# Patient Record
Sex: Female | Born: 1995 | Race: White | Hispanic: No | Marital: Single | Smoking: Never smoker
Health system: Southern US, Community
[De-identification: ages and names within clinical notes are randomized; demographics above are authoritative.]

## PROBLEM LIST (undated history)

## (undated) DIAGNOSIS — J45909 Unspecified asthma, uncomplicated: Secondary | ICD-10-CM

## (undated) DIAGNOSIS — Z87440 Personal history of urinary (tract) infections: Secondary | ICD-10-CM

## (undated) HISTORY — DX: Personal history of urinary (tract) infections: Z87.440

## (undated) HISTORY — DX: Unspecified asthma, uncomplicated: J45.909

---
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
## 2014-01-23 ENCOUNTER — Encounter: Payer: Self-pay | Admitting: Family Medicine

## 2014-01-23 ENCOUNTER — Ambulatory Visit: Payer: 59 | Admitting: Family Medicine

## 2014-01-23 ENCOUNTER — Ambulatory Visit (INDEPENDENT_AMBULATORY_CARE_PROVIDER_SITE_OTHER): Payer: 59 | Admitting: Family Medicine

## 2014-01-23 VITALS — BP 100/70 | HR 73 | Temp 98.4°F | Ht 61.5 in | Wt 138.5 lb

## 2014-01-23 DIAGNOSIS — M4306 Spondylolysis, lumbar region: Secondary | ICD-10-CM

## 2014-01-23 DIAGNOSIS — M4310 Spondylolisthesis, site unspecified: Secondary | ICD-10-CM

## 2014-01-23 NOTE — Progress Notes (Signed)
Pre visit review using our clinic review tool, if applicable. No additional management support is needed unless otherwise documented below in the visit note. 

## 2014-01-23 NOTE — Progress Notes (Signed)
[address] [place] [address] Phone: [phone] Fax: [phone]  Patient ID: Martha Landry MRN: [id number], DOB: [birth year], 18 y.o. Date of Encounter: 01/23/2014  Primary Physician:  No primary provider on file.   Chief Complaint: Follow-up   Subjective:   History of Present Illness:  Martha Landry is a 18 y.o. very pleasant female patient who presents with the following:  She is here in follow-up and feels great and is asymptomatic. Has been doing a little bit of weights, but no ext, no fast pitch.  Working in a Nurse, learning disability.   All feeling good Lessons to younger girls.    11/22/2013 Last OV with Martha Beat, MD  New patient, 2nd opinion case.   She initially saw someone at [facility], they are unsure of the name. At walk-in and has been seen at Orthopedics.   Pitcher on the softball team, "nothing else seems to bother it", then will get a crippling pain when she is trying to pitch.  2 months off from pitching.  Meloxicam - taking 15 mg. Pitched last month and now back   She also complains of pain with squats, clean and jerk, snatches, overhead presses. Some pain with running but less so.  No numbness or tingling anywhere.   [employer], pitching. 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   Very active, runs, lifts weights for softball.  Feels good otherwise and is essentially otherwise healthy.   Past Medical History, Surgical History, Social History, Family History, Problem List, Medications, and Allergies have been reviewed and updated if relevant.  Review of Systems:  GEN: No fevers, chills. Nontoxic. Primarily MSK c/o today. MSK: Detailed in the HPI GI: tolerating PO intake without difficulty Neuro: No numbness, parasthesias, or tingling associated. Otherwise, the pertinent positives and negatives are listed above and in the HPI, otherwise a full review of systems has been reviewed  and is negative unless noted positive.   Objective:   Physical Examination: BP 100/70  Pulse 73  Temp(Src) 98.4 F (36.9 C) (Oral)  Ht 5' 1.5" (1.562 m)  Wt 138 lb 8 oz (62.823 kg)  BMI 25.75 kg/m2  LMP 01/20/2014   GEN: Well-developed,well-nourished,in no acute distress; alert,appropriate and cooperative throughout examination HEENT: Normocephalic and atraumatic without obvious abnormalities. Ears, externally no deformities PULM: Breathing comfortably in no respiratory distress EXT: No clubbing, cyanosis, or edema PSYCH: Normally interactive. Cooperative during the interview. Pleasant. Friendly and conversant. Not anxious or depressed appearing. Normal, full affect.  Range of motion at  the waist: Flexion: normal Extension: normal Lateral bending: normal Rotation: all normal  No echymosis or edema Rises to examination table with no difficulty Gait: non antalgic  Inspection/Deformity: N Paraspinus Tenderness: none  B Ankle Dorsiflexion (L5,4): 5/5 B Great Toe Dorsiflexion (L5,4): 5/5 Heel Walk (L5): WNL Toe Walk (S1): WNL Rise/Squat (L4): WNL  SENSORY B Medial Foot (L4): WNL B Dorsum (L5): WNL B Lateral (S1): WNL Light Touch: WNL Pinprick: WNL  REFLEXES Knee (L4): 2+ Ankle (S1): 2+  B SLR, seated: neg B SLR, supine: neg B FABER: neg B Reverse FABER: neg B Greater Troch: NT B Log Roll: neg B Stork: NT B Sciatic Notch: NT   Radiology: Dg Lumbar Spine Complete  11/22/2013   CLINICAL DATA:  Progressive low back pain  EXAM: LUMBAR SPINE - COMPLETE 4+ VIEW  COMPARISON:  None.  FINDINGS: Frontal, lateral, spot lumbosacral lateral, bilateral oblique views were obtained. There  are 5 non-rib-bearing lumbar type vertebral bodies. There is no fracture or spondylolisthesis. Disk spaces appear intact. There is evidence of a pars defect on the left at L5 with sclerosis surrounding the pars defect on the left at L5. No other similar sclerosis is seen. No other pars  defects are appreciated. There is no appreciable facet arthropathy.  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 No other abnormality noted.  If further imaging is felt to be advisable based on patient's clinical symptoms, would advise nuclear medicine SPECT bone scan of the lumbar spine.  These results were called by telephone at the time of interpretation on 11/22/2013 at 1:25 PM to Dr. Hannah BeatSPENCER Martha Landry , who verbally acknowledged these results.   Electronically Signed   By: Bretta BangWilliam  Woodruff M.D.   On: 11/22/2013 13:25    Assessment & Plan:   Spondylolysis of lumbar region   She is doing great. Reviewed return to sport and return to pitching program  11/22/2013 Last OV with Martha BeatSpencer Sharay Bellissimo, MD  >45 minutes spent in face to face time with patient, >50% spent in counselling or coordination of care: I called Dr. Margarita GrizzleWoodruff at the time of the intial film and discussed case, my high clinical concern about spondylolysis, more likely on the LEFT based on exam. We look at the films via conference, and I agree that there are subtle changes at L5 c/w pars stress fracture and sclerosis.   The patient and her father and i reviewed all films, and discussed findings, anatomy, and implications.  Using an anatomical model, I reviewed with the patient the structures involved and how they related to their diagnosis .  We discussed +/- SPECT scanning to confirm diagnosis.   With my level of clinical suspicion, + XR, they understood and agreed that altered play for the summer was best course. No pitching, batting, hitting, or playing, at least 4 weeks - minimum. In discussion, summer matters very little to her play other than basic conditioning, weights. Reviewed no squatting, olympic lifts, running, or anything to load the spine. They will discuss with her coach, but anticipate full 8 weeks of rest.   Signed,  Cowen Pesqueira T. Jenaveve Fenstermaker, MD, CAQ Sports Medicine   Discontinued  Medications   No medications on file   Current Medications at Discharge:   Medication List       This list is accurate as of: 01/23/14 11:59 PM.  Always use your most recent med list.               meloxicam 15 MG tablet  Commonly known as:  MOBIC  Take 1 tablet by mouth daily.

## 2014-09-02 ENCOUNTER — Encounter: Payer: Self-pay | Admitting: Family Medicine

## 2014-09-02 ENCOUNTER — Ambulatory Visit (INDEPENDENT_AMBULATORY_CARE_PROVIDER_SITE_OTHER): Payer: BLUE CROSS/BLUE SHIELD | Admitting: Family Medicine

## 2014-09-02 ENCOUNTER — Ambulatory Visit (INDEPENDENT_AMBULATORY_CARE_PROVIDER_SITE_OTHER)
Admission: RE | Admit: 2014-09-02 | Discharge: 2014-09-02 | Disposition: A | Discharge status: Home / Self Care | Payer: BLUE CROSS/BLUE SHIELD | Source: Ambulatory Visit | Attending: Family Medicine | Admitting: Family Medicine

## 2014-09-02 VITALS — BP 110/72 | HR 69 | Temp 98.3°F | Ht 61.5 in | Wt 153.2 lb

## 2014-09-02 DIAGNOSIS — S6991XA Unspecified injury of right wrist, hand and finger(s), initial encounter: Secondary | ICD-10-CM

## 2014-09-02 DIAGNOSIS — M25531 Pain in right wrist: Secondary | ICD-10-CM

## 2014-09-02 NOTE — Progress Notes (Signed)
Pre visit review using our clinic review tool, if applicable. No additional management support is needed unless otherwise documented below in the visit note. 

## 2014-09-03 NOTE — Progress Notes (Signed)
Dr. Karleen HampshireSpencer T. Oley Lahaie, MD, CAQ Sports Medicine Primary Care and Sports Medicine [address] [place], [id number] Phone: [phone] Fax: [phone]  09/02/2014  Patient: Martha Landry, MRN: [id number], DOB: [birth year], 19 y.o.  Primary Physician:  No primary care provider on file.  Chief Complaint: Hand Injury  Subjective:   Martha Landry is a 19 y.o. 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  She was able to continue pitching, but she wanted to, and have this evaluated and make sure there was no fracture.  There is a small amount of swelling.  No bruising.  Past Medical History, Surgical History, Social History, Family History, Problem List, Medications, and Allergies have been reviewed and updated if relevant.  GEN: No fevers, chills. Nontoxic. Primarily MSK c/o today. MSK: Detailed in the HPI GI: tolerating PO intake without difficulty Neuro: No numbness, parasthesias, or tingling associated. Otherwise the pertinent positives of the ROS are noted above.   Objective:   BP 110/72 mmHg  Pulse 69  Temp(Src) 98.3 F (36.8 C) (Oral)  Ht 5' 1.5" (1.562 m)  Wt 153 lb 4 oz (69.514 kg)  BMI 28.49 kg/m2  LMP 08/19/2014   GEN: WDWN, NAD, Non-toxic, Alert & Oriented x 3 HEENT: Atraumatic, Normocephalic.  Ears and Nose: No external deformity. EXTR: No clubbing/cyanosis/edema NEURO: Normal gait.  PSYCH: Normally interactive. Conversant. Not depressed or anxious appearing.  Calm demeanor.    On the right hand and all fingers she is nontender.  She does have some tenderness at the base of the second and third metacarpals.  Axial loading is nontender.  There is minimal pain with ulnar and radial deviation.  Nontender throughout the radius and ulna.  Her grip is normal and intact.  Radiology: Dg Wrist Complete Right  09/02/2014   CLINICAL DATA:  Softball injury of the right hand  and wrist yesterday  EXAM: RIGHT WRIST - COMPLETE 3+ VIEW  COMPARISON:  Right hand series of today's date  FINDINGS: The bones of the right wrist are adequately mineralized. There is no acute fracture nor dislocation. Specific attention to the intercarpal and carpo metacarpal joints reveals no acute abnormality. The soft tissues are unremarkable.  IMPRESSION: There is no acute bony abnormality of the right wrist.   Electronically Signed   By: David  SwazilandJordan   On: 09/02/2014 15:46   Dg Hand Complete Right  09/02/2014   CLINICAL DATA:  Softball injury yesterday now with pain in the metacarpal region  EXAM: RIGHT HAND - COMPLETE 3+ VIEW  COMPARISON:  Right wrist series of today's date  FINDINGS: The bones of the hand are adequately mineralized. The interphalangeal, metacarpophalangeal, and carpometacarpal joints are normal. The phalanges and metacarpals are intact. The soft tissues are unremarkable. The visualized portions of the carpal bones and distal radius and ulna are unremarkable.  IMPRESSION: There is no acute bony abnormality of the right hand.   Electronically Signed   By: David  Sw[place]Jordan   On: 09/02/2014 15:25    Assessment and Plan:   Hand injury, right, initial encounter - Plan: DG Hand Complete Right  Wrist pain, right - Plan: DG Wrist Complete Right  No apparent fx. Bone contusion only. Can play as tolerated. I reviewed the films with Dr. Swazil[place]Jordan on the phone, as well.  New Prescriptions   No medications on file   Orders Placed This Encounter  Procedures  . DG Hand  Complete Right  . DG Wrist Complete Right    Signed,  Lamanda Rudder T. Jemeka Wagler, MD   Patient's Medications  New Prescriptions   No medications on file  Previous Medications   No medications on file  Modified Medications   No medications on file  Discontinued Medications   MELOXICAM (MOBIC) 15 MG TABLET    Take 1 tablet by mouth daily.

## 2014-09-16 ENCOUNTER — Encounter: Payer: Self-pay | Admitting: Family Medicine

## 2015-03-21 ENCOUNTER — Ambulatory Visit
Admission: RE | Admit: 2015-03-21 | Discharge: 2015-03-21 | Disposition: A | Discharge status: Home / Self Care | Payer: BLUE CROSS/BLUE SHIELD | Source: Ambulatory Visit | Attending: Pediatrics | Admitting: Pediatrics

## 2015-03-21 DIAGNOSIS — R079 Chest pain, unspecified: Secondary | ICD-10-CM | POA: Insufficient documentation

## 2015-03-29 IMAGING — CR DG HAND COMPLETE 3+V*R*
3 series · 3 of 3 positions shown · non-contrast
Comparison: Right wrist series of today's date

CLINICAL DATA: Softball injury yesterday now with pain in the
metacarpal region

EXAM:
RIGHT HAND - COMPLETE 3+ VIEW

[view not recorded (1 of 3)]
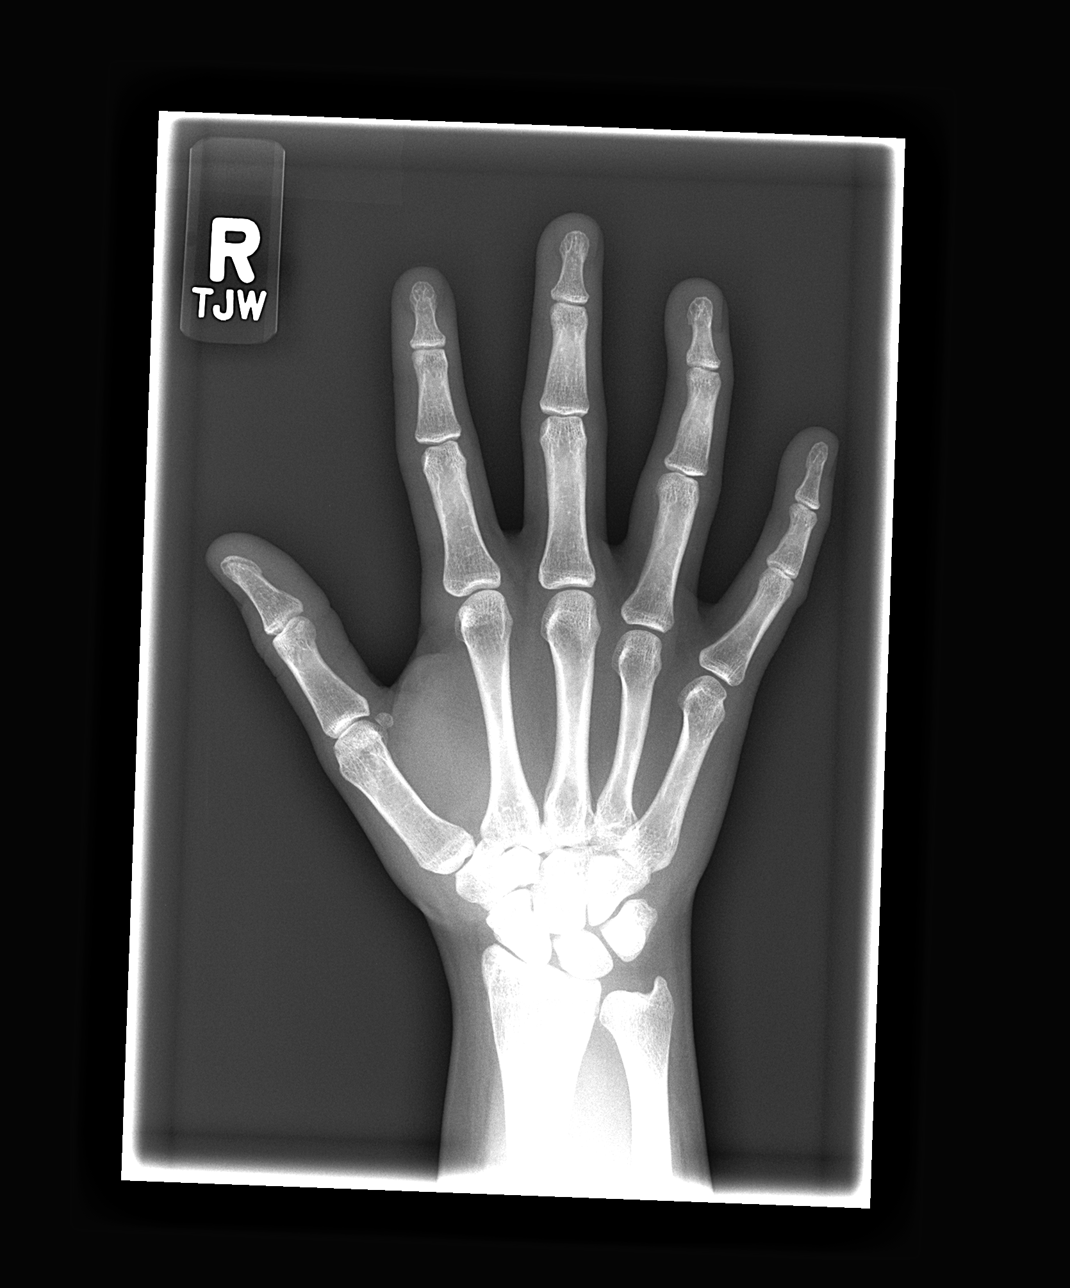

[view not recorded (2 of 3)]
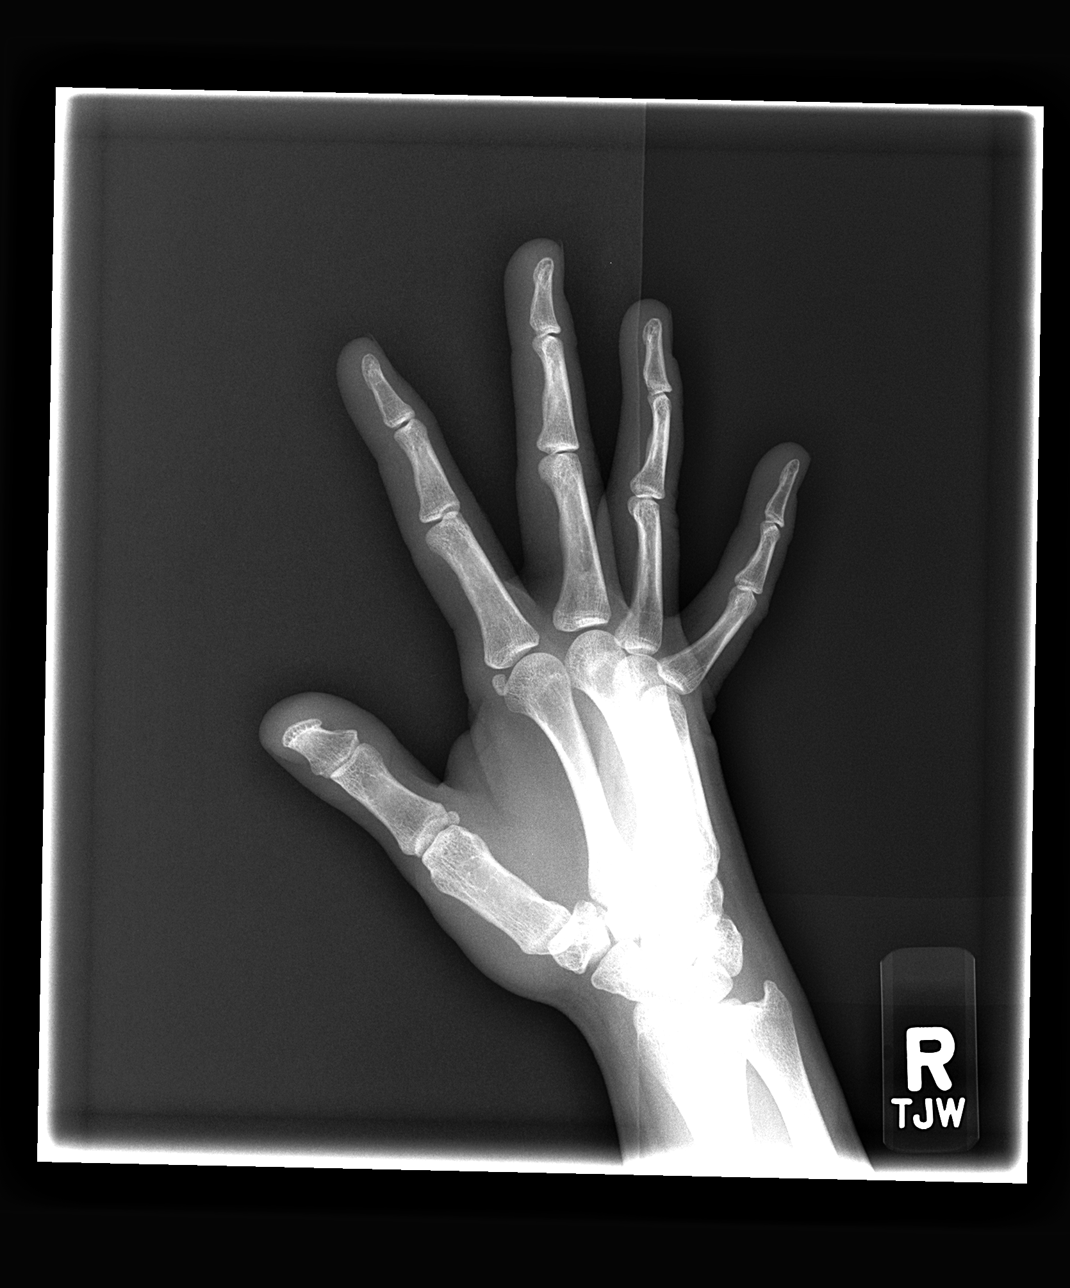

[view not recorded (3 of 3)]
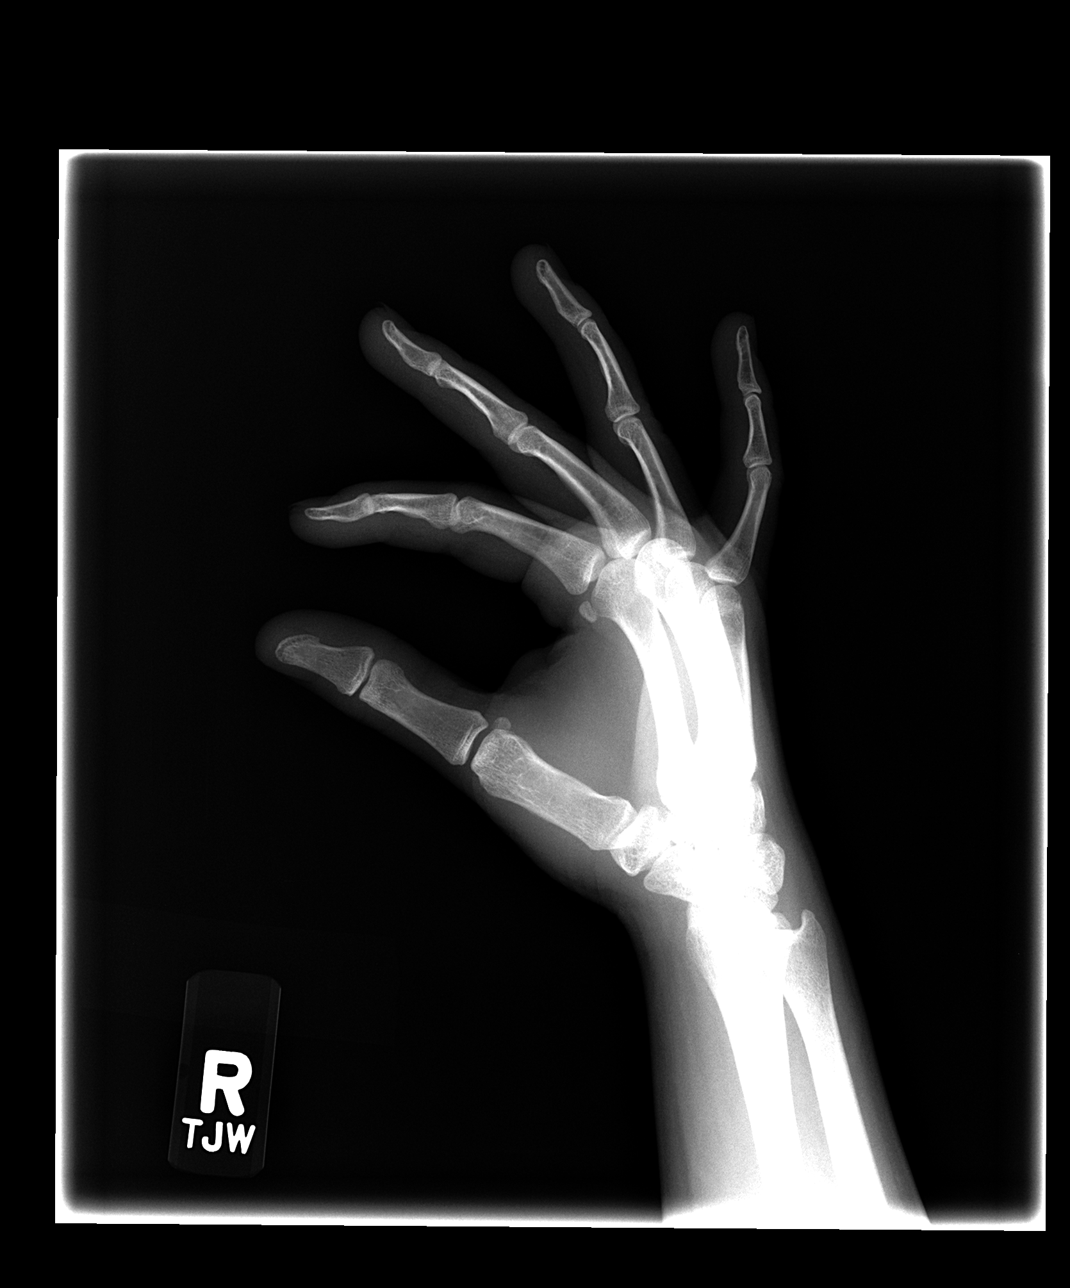

[3 of 3 positions shown; findings below may reference images not displayed]

FINDINGS: The bones of the hand are adequately mineralized. The
interphalangeal, metacarpophalangeal, and carpometacarpal joints are
normal. The phalanges and metacarpals are intact. The soft tissues
are unremarkable. The visualized portions of the carpal bones and
distal radius and ulna are unremarkable.
IMPRESSION: There is no acute bony abnormality of the right hand.

## 2019-05-02 ENCOUNTER — Other Ambulatory Visit: Payer: Self-pay | Admitting: *Deleted

## 2019-05-02 DIAGNOSIS — Z20822 Contact with and (suspected) exposure to covid-19: Secondary | ICD-10-CM

## 2019-05-04 LAB — NOVEL CORONAVIRUS, NAA: SARS-CoV-2, NAA: DETECTED — AB

## 2019-07-08 ENCOUNTER — Ambulatory Visit
Admission: EM | Admit: 2019-07-08 | Discharge: 2019-07-08 | Disposition: A | Discharge status: Home / Self Care | Payer: BC Managed Care – PPO | Attending: Family Medicine | Admitting: Family Medicine

## 2019-07-08 ENCOUNTER — Ambulatory Visit
Admit: 2019-07-08 | Discharge: 2019-07-08 | Disposition: A | Discharge status: Home / Self Care | Payer: BC Managed Care – PPO | Attending: Family Medicine | Admitting: Family Medicine

## 2019-07-08 ENCOUNTER — Encounter: Payer: Self-pay | Admitting: Emergency Medicine

## 2019-07-08 ENCOUNTER — Other Ambulatory Visit: Payer: Self-pay

## 2019-07-08 DIAGNOSIS — D72829 Elevated white blood cell count, unspecified: Secondary | ICD-10-CM | POA: Diagnosis present

## 2019-07-08 DIAGNOSIS — R1013 Epigastric pain: Secondary | ICD-10-CM | POA: Diagnosis present

## 2019-07-08 DIAGNOSIS — R1011 Right upper quadrant pain: Secondary | ICD-10-CM

## 2019-07-08 LAB — URINALYSIS, COMPLETE (UACMP) WITH MICROSCOPIC
Bilirubin Urine: NEGATIVE
Glucose, UA: NEGATIVE mg/dL
Hgb urine dipstick: NEGATIVE
Ketones, ur: NEGATIVE mg/dL
Nitrite: NEGATIVE
Specific Gravity, Urine: 1.025 (ref 1.005–1.030)
pH: 7.5 (ref 5.0–8.0)

## 2019-07-08 LAB — COMPREHENSIVE METABOLIC PANEL
ALT: 13 U/L (ref 0–44)
AST: 13 U/L — ABNORMAL LOW (ref 15–41)
Albumin: 4.6 g/dL (ref 3.5–5.0)
Alkaline Phosphatase: 58 U/L (ref 38–126)
Anion gap: 8 (ref 5–15)
BUN: 11 mg/dL (ref 6–20)
CO2: 24 mmol/L (ref 22–32)
Calcium: 9.4 mg/dL (ref 8.9–10.3)
Chloride: 103 mmol/L (ref 98–111)
Creatinine, Ser: 0.65 mg/dL (ref 0.44–1.00)
GFR calc Af Amer: >60 mL/min (ref >60)
GFR calc non Af Amer: >60 mL/min (ref >60)
Glucose, Bld: 96 mg/dL (ref 70–99)
Potassium: 4 mmol/L (ref 3.5–5.1)
Sodium: 135 mmol/L (ref 135–145)
Total Bilirubin: 1.2 mg/dL (ref 0.3–1.2)
Total Protein: 8.5 g/dL — ABNORMAL HIGH (ref 6.5–8.1)

## 2019-07-08 LAB — CBC WITH DIFFERENTIAL/PLATELET
Abs Immature Granulocytes: 0.05 10*3/uL (ref 0.00–0.07)
Basophils Absolute: 0 10*3/uL (ref 0.0–0.1)
Basophils Relative: 0 %
Eosinophils Absolute: 0 10*3/uL (ref 0.0–0.5)
Eosinophils Relative: 0 %
HCT: 38.4 % (ref 36.0–46.0)
Hemoglobin: 13.6 g/dL (ref 12.0–15.0)
Immature Granulocytes: 0 %
Lymphocytes Relative: 15 %
Lymphs Abs: 2 10*3/uL (ref 0.7–4.0)
MCH: 32.3 pg (ref 26.0–34.0)
MCHC: 35.4 g/dL (ref 30.0–36.0)
MCV: 91.2 fL (ref 80.0–100.0)
Monocytes Absolute: 1 10*3/uL (ref 0.1–1.0)
Monocytes Relative: 7 %
Neutro Abs: 10.3 10*3/uL — ABNORMAL HIGH (ref 1.7–7.7)
Neutrophils Relative %: 78 %
Platelets: 263 10*3/uL (ref 150–400)
RBC: 4.21 MIL/uL (ref 3.87–5.11)
RDW: 11.5 % (ref 11.5–15.5)
WBC: 13.4 10*3/uL — ABNORMAL HIGH (ref 4.0–10.5)
nRBC: 0 % (ref 0.0–0.2)

## 2019-07-08 MED ORDER — ONDANSETRON HCL 4 MG PO TABS: 4.0000 mg | ORAL_TABLET | Freq: Three times a day (TID) | ORAL | 0 refills | Status: AC | PRN

## 2019-07-08 MED ORDER — PANTOPRAZOLE SODIUM 40 MG PO TBEC: 40.0000 mg | DELAYED_RELEASE_TABLET | Freq: Every day | ORAL | 0 refills | Status: AC

## 2019-07-08 NOTE — ED Provider Notes (Signed)
[facility]    CSN: [id number] Arrival date & time: 07/08/19  1032      History   Chief Complaint Chief Complaint  Patient presents with  . Abdominal Pain    URQ   HPI  24 year old female presents with abdominal pain.  Patient reports a 3-day history of abdominal pain.  Patient has upper abdominal pain as well as lower abdominal pain.  She has had ongoing constipation.  She has taken MiraLAX and had a bowel movement this morning.  She reports gas and bloating.  She states that her pain as well as the gas and bloating seems to be worse after she eats.  She describes the pain as a dull sensation.  Pain worse in the upper abdomen but she does note that she has midline lower abdominal pain as well.  She reports an elevated temperature last night of 100.1.  She states that she had chills as well.  She has had nausea but no vomiting.  Denies urinary symptoms.  Last menstrual period was 2 weeks ago.  Patient reports that she feels better today than she did last night but still has symptoms and this is what brought her in for evaluation.   PMH, Surgical Hx, Family Hx, Social History reviewed and updated as below.  Past Medical History:  Diagnosis Date  . Asthma   . History of UTI    History reviewed. No pertinent surgical history.  OB History   No obstetric history on file.    Home Medications    Prior to Admission medications   Medication Sig Start Date End Date Taking? Authorizing Provider  ondansetron (ZOFRAN) 4 MG tablet Take 1 tablet (4 mg total) by mouth every 8 (eight) hours as needed for nausea or vomiting. 07/08/19   Tommie Sams, DO  pantoprazole (PROTONIX) 40 MG tablet Take 1 tablet (40 mg total) by mouth daily. 07/08/19   Tameya Kuznia G, DO  TRI-ESTARYLLA 0.18/0.215/0.25 MG-35 MCG tablet Take 1 tablet by mouth daily. 06/14/19   [provider]    Family History Family History  Problem Relation Age of Onset  . Arthritis Father   . Prostate cancer  Father   . Colon cancer Maternal Grandfather   . High blood pressure Mother     Social History Social History   Tobacco Use  . Smoking status: Never Smoker  . Smokeless tobacco: Never Used  Substance Use Topics  . Alcohol use: No  . Drug use: No     Allergies   Patient has no known allergies.   Review of Systems Review of Systems  Constitutional: Positive for chills.  Gastrointestinal: Positive for abdominal pain, constipation and nausea.  Musculoskeletal: Positive for back pain.   Physical Exam Triage Vital Signs ED Triage Vitals  Enc Vitals Group     BP 07/08/19 1048 (!) 144/95     Pulse Rate 07/08/19 1048 (!) 111     Resp 07/08/19 1048 14     Temp 07/08/19 1048 98.8 F (37.1 C)     Temp Source 07/08/19 1048 Oral     SpO2 07/08/19 1048 100 %     Weight 07/08/19 1047 160 lb (72.6 kg)     Height 07/08/19 1047 5\' 1"  (1.549 m)     Head Circumference --      Peak Flow --      Pain Score 07/08/19 1047 6     Pain Loc --      Pain Edu? --  Excl. in [place]? --    Updated Vital Signs BP (!) 144/95 (BP Location: Left Arm)   Pulse (!) 111   Temp 98.8 F (37.1 C) (Oral)   Resp 14   Ht 5\' 1"  (1.549 m)   Wt 72.6 kg   LMP 06/24/2019 (Approximate)   SpO2 100%   BMI 30.23 kg/m   Visual Acuity Right Eye Distance:   Left Eye Distance:   Bilateral Distance:    Right Eye Near:   Left Eye Near:    Bilateral Near:     Physical Exam Vitals and nursing note reviewed.  Constitutional:      General: She is not in acute distress.    Appearance: Normal appearance. She is not ill-appearing.  HENT:     Head: Normocephalic and atraumatic.  Eyes:     General:        Right eye: No discharge.        Left eye: No discharge.     Conjunctiva/sclera: Conjunctivae normal.  Cardiovascular:     Rate and Rhythm: Regular rhythm. Tachycardia present.  Pulmonary:     Effort: Pulmonary effort is normal.     Breath sounds: Normal breath sounds. No wheezing, rhonchi or rales.    Abdominal:     General: There is no distension.     Palpations: Abdomen is soft.     Comments: Tender to palpation in the RUQ and epigastric region.  Patient also with tenderness in the suprapubic region. No rebound.  Positive Murphy sign.  Neurological:     Mental Status: She is alert.  Psychiatric:        Mood and Affect: Mood normal.        Behavior: Behavior normal.    UC Treatments / Results  Labs (all labs ordered are listed, but only abnormal results are displayed) Labs Reviewed  CBC WITH DIFFERENTIAL/PLATELET - Abnormal; Notable for the following components:      Result Value   WBC 13.4 (*)    Neutro Abs 10.3 (*)    All other components within normal limits  COMPREHENSIVE METABOLIC PANEL - Abnormal; Notable for the following components:   Total Protein 8.5 (*)    AST 13 (*)    All other components within normal limits  URINALYSIS, COMPLETE (UACMP) WITH MICROSCOPIC - Abnormal; Notable for the following components:   APPearance CLOUDY (*)    Protein, ur TRACE (*)    Leukocytes,Ua TRACE (*)    Bacteria, UA MANY (*)    All other components within normal limits  URINE CULTURE    EKG   Radiology US Abdomen Limited RUQ  Result Date: 07/08/2019 CLINICAL DATA:  3 day history of right upper quadrant pain. Leukocytosis. EXAM: ULTRASOUND ABDOMEN LIMITED RIGHT UPPER QUADRANT COMPARISON:  None. FINDINGS: Gallbladder: No gallstones or gallbladder wall thickening. No pericholecystic fluid. The sonographer reports no sonographic Murphy's sign. Common bile duct: Diameter: 4 mm Liver: No focal lesion identified. Within normal limits in parenchymal echogenicity. Portal vein is patent on color Doppler imaging with normal direction of blood flow towards the liver. Other: None. IMPRESSION: Normal exam. Electronically Signed   By: Misty Stanley M.D.   On: 07/08/2019 12:47    Procedures Procedures (including critical care time)  Medications Ordered in UC Medications - No data to  display  Initial Impression / Assessment and Plan / UC Course  I have reviewed the triage vital signs and the nursing notes.  Pertinent labs & imaging results that were available during  my care of the patient were reviewed by me and considered in my medical decision making (see chart for details).    24 year old female presents with right upper quadrant pain and epigastric pain as well as lower abdominal pain.  Urinalysis not consistent with UTI.  Laboratory studies with mild leukocytosis.  Patient with reported systemic symptoms of low-grade fever and chills (this is an acute illness with systemic symptoms). Concern for possible cholecystitis.  Sending for right upper quadrant ultrasound.  ** 1250 [place] was normal.  Given epigastric & RUQ pain, placing on protonix for possible GERD. Continue miralax for constipation. If pain persists or worsens, she will need CT imaging.   Final Clinical Impressions(s) / UC Diagnoses   Final diagnoses:  RUQ pain  Leukocytosis, unspecified type  Epigastric pain     Discharge Instructions     Go to the medical mall at [facility].  I will call with the results of your [place].  Take care  Dr. Adriana Simas     ED Prescriptions    Medication Sig Dispense Auth. Provider   pantoprazole (PROTONIX) 40 MG tablet Take 1 tablet (40 mg total) by mouth daily. 30 tablet Jakara Blatter G, DO   ondansetron (ZOFRAN) 4 MG tablet Take 1 tablet (4 mg total) by mouth every 8 (eight) hours as needed for nausea or vomiting. 20 tablet Tommie Sams, DO     PDMP not reviewed this encounter.   Tommie Sams, [place] 07/08/19 1256

## 2019-07-08 NOTE — Discharge Instructions (Addendum)
Go to the medical mall at [facility].  I will call with the results of your [place].  Take care  Dr. Adriana Simas

## 2019-07-08 NOTE — ED Triage Notes (Signed)
Patient c/o RUQ abdominal pain that started 3 days ago.  Patient also states that she has been constipated for the past 2 days.  Patient states that she has been taking Miralax and had a bowel movement this morning.  Patient states that she has a lot of gas and pressure.

## 2019-07-09 LAB — URINE CULTURE: Culture: <10000 — AB

## 2019-07-30 ENCOUNTER — Other Ambulatory Visit: Payer: Self-pay | Admitting: Family Medicine

## 2020-02-01 IMAGING — US US ABDOMEN LIMITED
1 series · 14 of 25 positions shown · non-contrast
Comparison: None.

CLINICAL DATA: 3 day history of right upper quadrant pain.
Leukocytosis.

EXAM:
ULTRASOUND ABDOMEN LIMITED RIGHT UPPER QUADRANT

[Series 1: us abdomen limited · 14 of 44 slices shown]
[im 1/44]
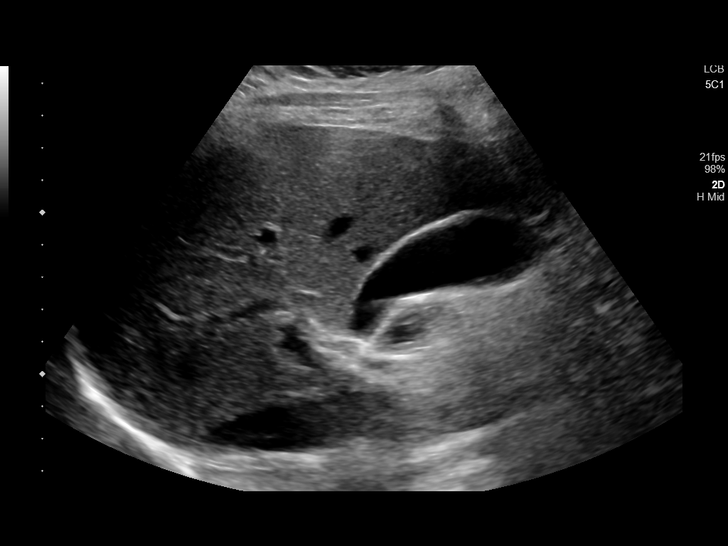
[im 4/44]
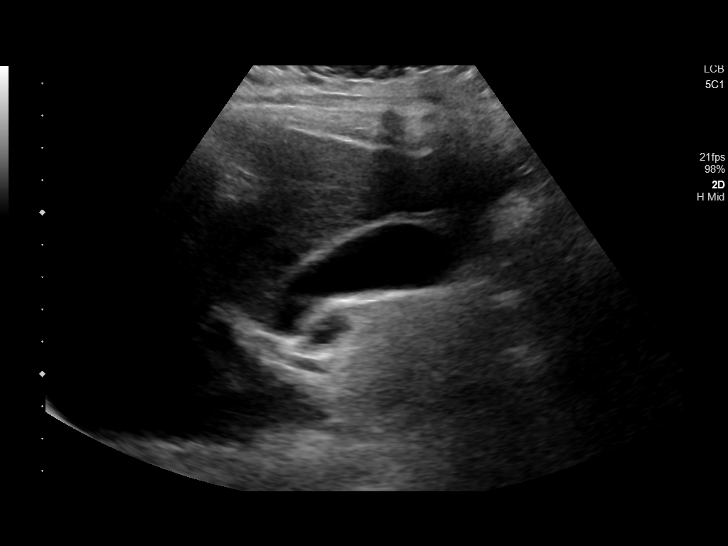
[im 8/44]
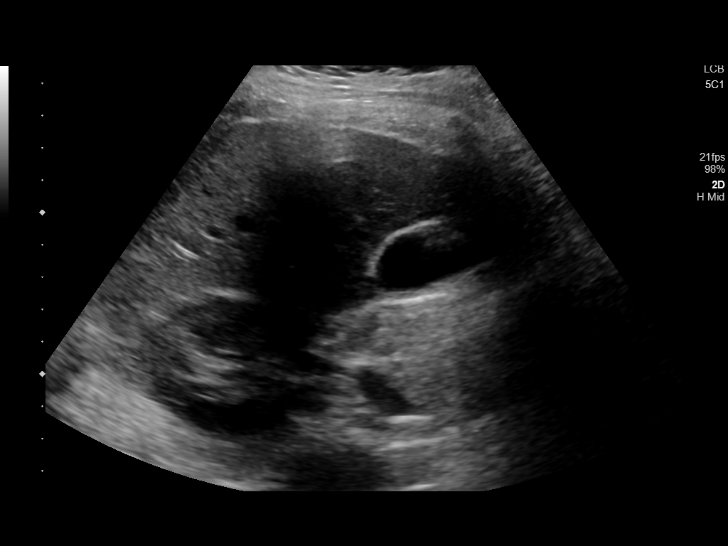
[im 11/44]
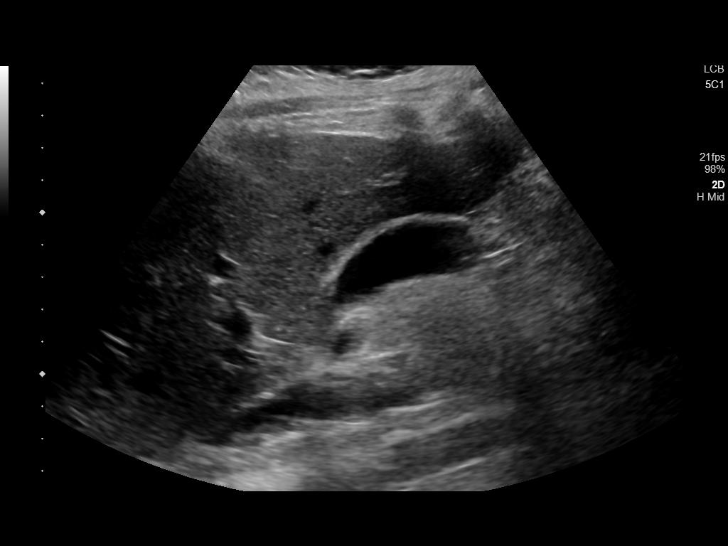
[im 15/44]
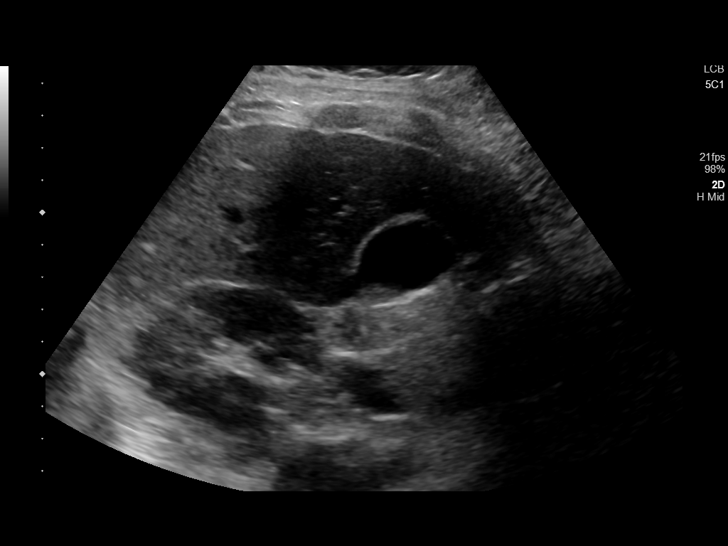
[im 17/44]
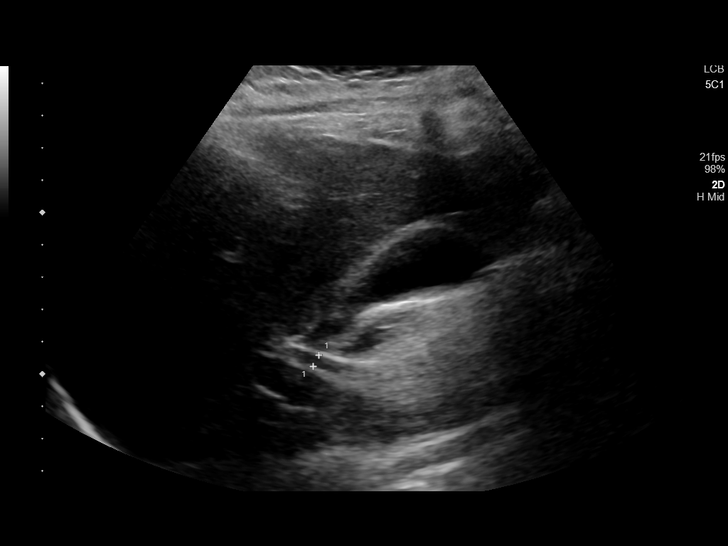
[im 20/44]
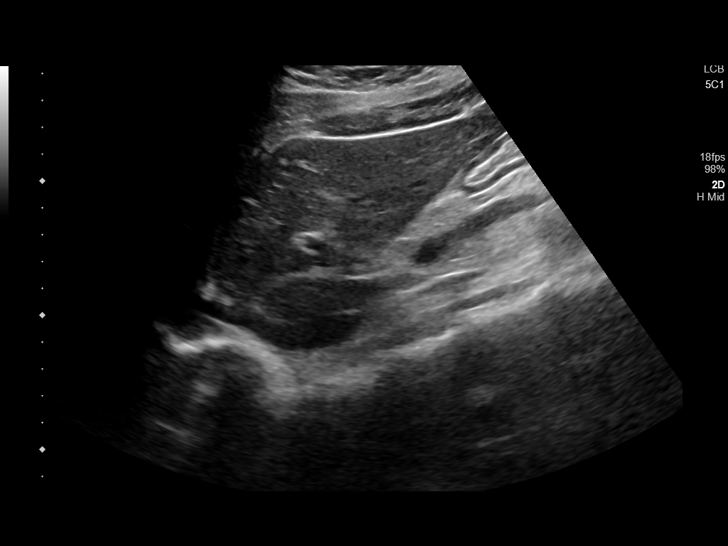
[im 24/44]
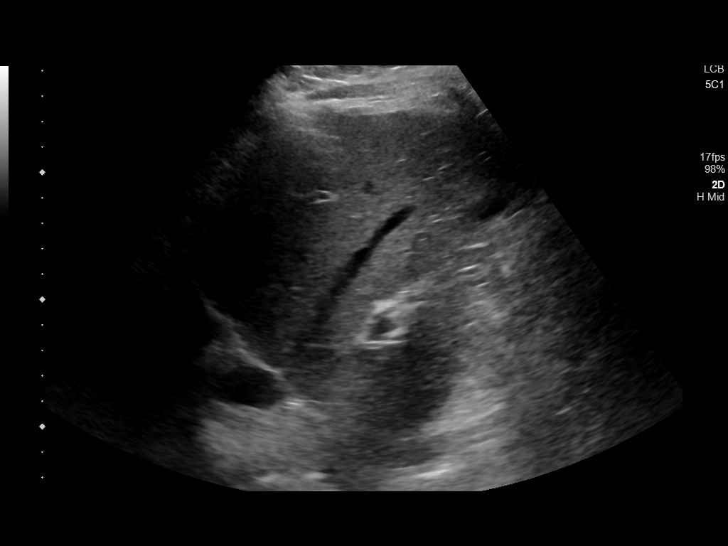
[im 27/44]
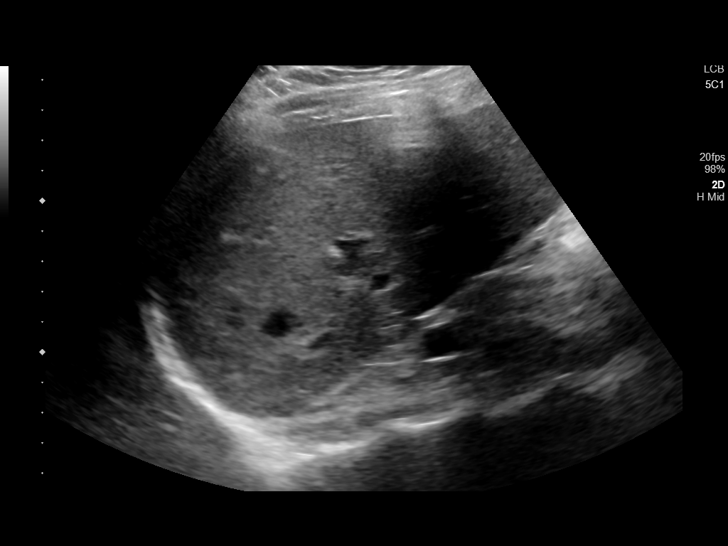
[im 29/44]
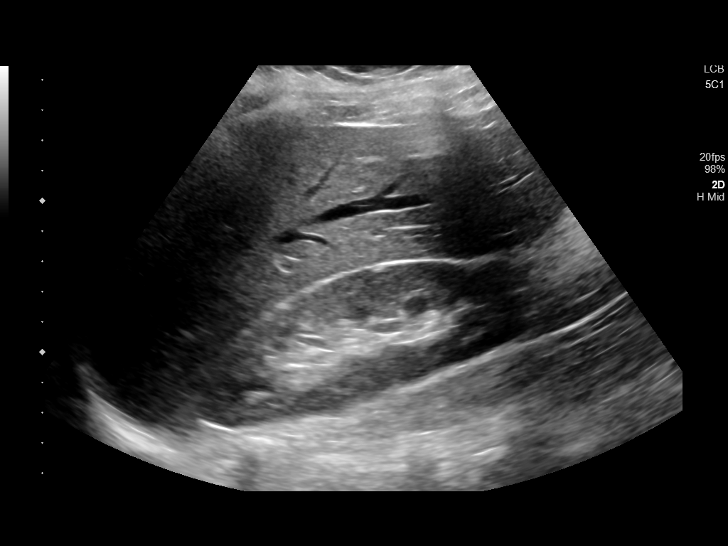
[im 33/44]
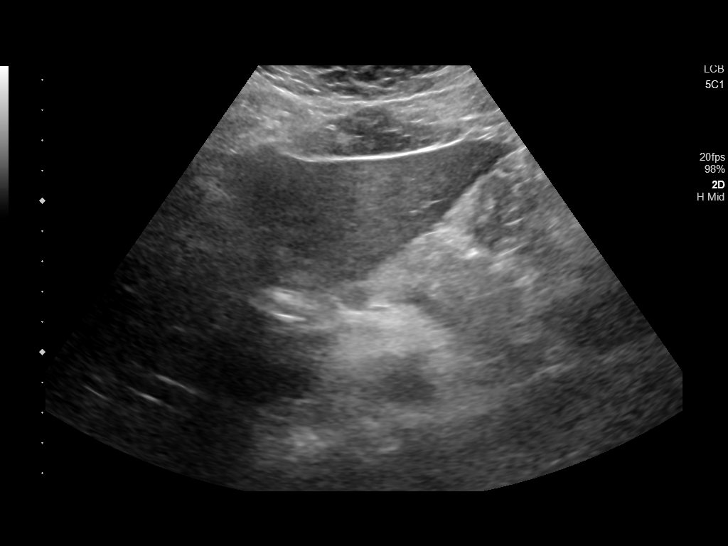
[im 36/44]
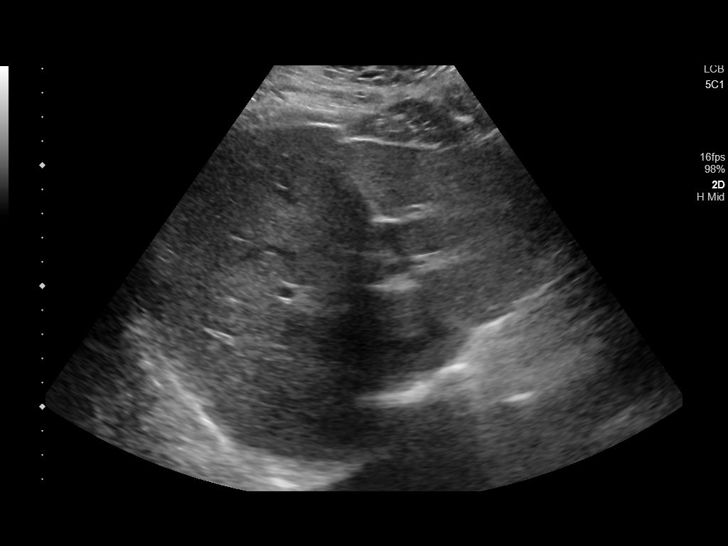
[im 40/44]
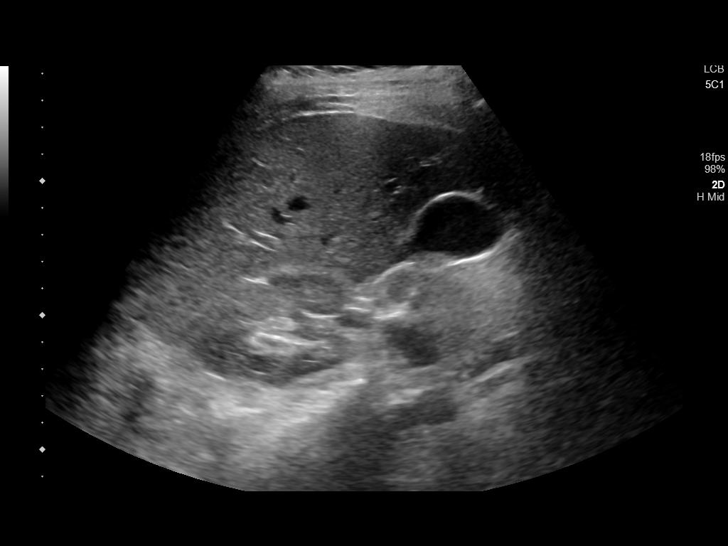
[im 44/44]
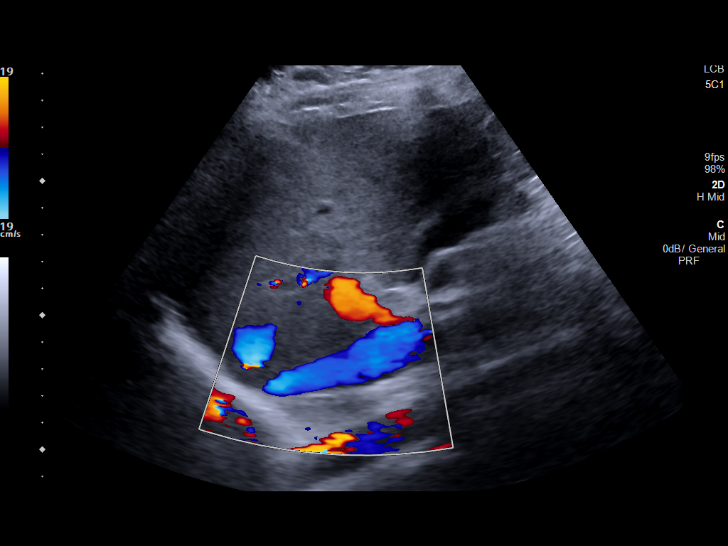

[14 of 25 positions shown; findings below may reference images not displayed]

FINDINGS: Gallbladder:

No gallstones or gallbladder wall thickening. No pericholecystic
fluid. The sonographer reports no sonographic Murphy's sign.

Common bile duct:

Diameter: 4 mm

Liver:

No focal lesion identified. Within normal limits in parenchymal
echogenicity. Portal vein is patent on color Doppler imaging with
normal direction of blood flow towards the liver.

Other: None.
IMPRESSION: Normal exam.
